# Patient Record
Sex: Female | Born: 1985 | Race: Black or African American | Hispanic: No | Marital: Single | Smoking: Former smoker
Health system: Southern US, Community
[De-identification: ages and names within clinical notes are randomized; demographics above are authoritative.]

## PROBLEM LIST (undated history)

## (undated) DIAGNOSIS — E669 Obesity, unspecified: Secondary | ICD-10-CM

## (undated) HISTORY — PX: TONSILLECTOMY: SUR1361

---
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
## 2006-04-25 ENCOUNTER — Emergency Department (HOSPITAL_COMMUNITY): Admission: EM | Admit: 2006-04-25 | Discharge: 2006-04-26 | Payer: Self-pay | Admitting: Emergency Medicine

## 2006-04-29 ENCOUNTER — Emergency Department (HOSPITAL_COMMUNITY): Admission: EM | Admit: 2006-04-29 | Discharge: 2006-04-29 | Payer: Self-pay | Admitting: Emergency Medicine

## 2006-12-07 ENCOUNTER — Emergency Department (HOSPITAL_COMMUNITY): Admission: EM | Admit: 2006-12-07 | Discharge: 2006-12-08 | Payer: Self-pay | Admitting: Emergency Medicine

## 2007-11-07 ENCOUNTER — Emergency Department (HOSPITAL_COMMUNITY): Admission: EM | Admit: 2007-11-07 | Discharge: 2007-11-07 | Payer: Self-pay | Admitting: Emergency Medicine

## 2015-07-10 ENCOUNTER — Emergency Department (HOSPITAL_COMMUNITY)
Admission: EM | Admit: 2015-07-10 | Discharge: 2015-07-10 | Disposition: A | Discharge status: Home / Self Care | Payer: BLUE CROSS/BLUE SHIELD | Attending: Emergency Medicine | Admitting: Emergency Medicine

## 2015-07-10 ENCOUNTER — Emergency Department (HOSPITAL_COMMUNITY): Payer: BLUE CROSS/BLUE SHIELD

## 2015-07-10 ENCOUNTER — Encounter (HOSPITAL_COMMUNITY): Payer: Self-pay | Admitting: Emergency Medicine

## 2015-07-10 DIAGNOSIS — J069 Acute upper respiratory infection, unspecified: Secondary | ICD-10-CM | POA: Insufficient documentation

## 2015-07-10 DIAGNOSIS — E669 Obesity, unspecified: Secondary | ICD-10-CM | POA: Insufficient documentation

## 2015-07-10 DIAGNOSIS — H9202 Otalgia, left ear: Secondary | ICD-10-CM | POA: Diagnosis not present

## 2015-07-10 DIAGNOSIS — R05 Cough: Secondary | ICD-10-CM | POA: Diagnosis present

## 2015-07-10 HISTORY — DX: Obesity, unspecified: E66.9

## 2015-07-10 MED ORDER — BENZONATATE 100 MG PO CAPS: 100.0000 mg | ORAL_CAPSULE | Freq: Three times a day (TID) | ORAL | Status: AC

## 2015-07-10 MED ORDER — HYDROCOD POLST-CPM POLST ER 10-8 MG/5ML PO SUER: 5.0000 mL | Freq: Two times a day (BID) | ORAL | Status: DC

## 2015-07-10 NOTE — ED Notes (Signed)
Pt able to ambulate independently 

## 2015-07-10 NOTE — ED Notes (Signed)
Pt. reports persistent dry cough for 1 1/2 weeks currently taking oral antibiotic ( Amoxicillin ) with no improvement , pt. added left ear discomfort , denies fever or chills / respirations unlabored .

## 2015-07-10 NOTE — ED Notes (Signed)
Spoke to radiology.  Will come to get patient

## 2015-07-10 NOTE — Discharge Instructions (Signed)
You were evaluated in the ED today for your cough. There does not appear to be an emergent cause for your symptoms at this time. Your exam was reassuring. Your chest x-ray showed no evidence of pneumonia. Your symptoms are likely viral in nature. Please take your medications as prescribed. Follow-up with your doctor as needed. Return to ED for new or worsening symptoms.  Upper Respiratory Infection, Adult Most upper respiratory infections (URIs) are a viral infection of the air passages leading to the lungs. A URI affects the nose, throat, and upper air passages. The most common type of URI is nasopharyngitis and is typically referred to as "the common cold." URIs run their course and usually go away on their own. Most of the time, a URI does not require medical attention, but sometimes a bacterial infection in the upper airways can follow a viral infection. This is called a secondary infection. Sinus and middle ear infections are common types of secondary upper respiratory infections. Bacterial pneumonia can also complicate a URI. A URI can worsen asthma and chronic obstructive pulmonary disease (COPD). Sometimes, these complications can require emergency medical care and may be life threatening.  CAUSES Almost all URIs are caused by viruses. A virus is a type of germ and can spread from one person to another.  RISKS FACTORS You may be at risk for a URI if:   You smoke.   You have chronic heart or lung disease.  You have a weakened defense (immune) system.   You are very young or very old.   You have nasal allergies or asthma.  You work in crowded or poorly ventilated areas.  You work in health care facilities or schools. SIGNS AND SYMPTOMS  Symptoms typically develop 2-3 days after you come in contact with a cold virus. Most viral URIs last 7-10 days. However, viral URIs from the influenza virus (flu virus) can last 14-18 days and are typically more severe. Symptoms may include:    Runny or stuffy (congested) nose.   Sneezing.   Cough.   Sore throat.   Headache.   Fatigue.   Fever.   Loss of appetite.   Pain in your forehead, behind your eyes, and over your cheekbones (sinus pain).  Muscle aches.  DIAGNOSIS  Your health care provider may diagnose a URI by:  Physical exam.  Tests to check that your symptoms are not due to another condition such as:  Strep throat.  Sinusitis.  Pneumonia.  Asthma. TREATMENT  A URI goes away on its own with time. It cannot be cured with medicines, but medicines may be prescribed or recommended to relieve symptoms. Medicines may help:  Reduce your fever.  Reduce your cough.  Relieve nasal congestion. HOME CARE INSTRUCTIONS   Take medicines only as directed by your health care provider.   Gargle warm saltwater or take cough drops to comfort your throat as directed by your health care provider.  Use a warm mist humidifier or inhale steam from a shower to increase air moisture. This may make it easier to breathe.  Drink enough fluid to keep your urine clear or pale yellow.   Eat soups and other clear broths and maintain good nutrition.   Rest as needed.   Return to work when your temperature has returned to normal or as your health care provider advises. You may need to stay home longer to avoid infecting others. You can also use a face mask and careful hand washing to prevent spread of the  virus.  Increase the usage of your inhaler if you have asthma.   Do not use any tobacco products, including cigarettes, chewing tobacco, or electronic cigarettes. If you need help quitting, ask your health care provider. PREVENTION  The best way to protect yourself from getting a cold is to practice good hygiene.   Avoid oral or hand contact with people with cold symptoms.   Wash your hands often if contact occurs.  There is no clear evidence that vitamin C, vitamin E, echinacea, or exercise  reduces the chance of developing a cold. However, it is always recommended to get plenty of rest, exercise, and practice good nutrition.  SEEK MEDICAL CARE IF:   You are getting worse rather than better.   Your symptoms are not controlled by medicine.   You have chills.  You have worsening shortness of breath.  You have brown or red mucus.  You have yellow or brown nasal discharge.  You have pain in your face, especially when you bend forward.  You have a fever.  You have swollen neck glands.  You have pain while swallowing.  You have white areas in the back of your throat. SEEK IMMEDIATE MEDICAL CARE IF:   You have severe or persistent:  Headache.  Ear pain.  Sinus pain.  Chest pain.  You have chronic lung disease and any of the following:  Wheezing.  Prolonged cough.  Coughing up blood.  A change in your usual mucus.  You have a stiff neck.  You have changes in your:  Vision.  Hearing.  Thinking.  Mood. MAKE SURE YOU:   Understand these instructions.  Will watch your condition.  Will get help right away if you are not doing well or get worse.   This information is not intended to replace advice given to you by your health care provider. Make sure you discuss any questions you have with your health care provider.   Document Released: 01/20/2001 Document Revised: 12/11/2014 Document Reviewed: 11/01/2013 Elsevier Interactive Patient Education [employer]2016 Elsevier Inc.

## 2015-07-10 NOTE — ED Provider Notes (Signed)
CSN: [id number]     Arrival date & time 07/10/15  2034 History  By signing my name below, I, Tanda RockersMargaux Venter, attest that this documentation has been prepared under the direction and in the presence of General MillsBenjamin Jermari Tamargo, PA-C. Electronically Signed: Tanda RockersMargaux Venter, ED Scribe. 07/10/2015. 9:00 PM.  Chief Complaint  Patient presents with  . Cough  . Otalgia   The history is provided by the patient. No language interpreter was used.     HPI Comments: Brittany Mcmillan is a 29 y.o. female who presents to the Emergency Department complaining of gradual onset, constant,  productive cough with yellow phlegm and myalgias x 1.5 weeks. Pt also complains of left ear pain with muffled hearing, sore throat from coughing, and increased dyspnea on exertion. She was seen at [facility] and was given prescription for Amoxicillin without relief. Denies fever, chills, hemoptysis, leg swelling, or any other associated symptoms. No recent travel or surgeries. No hx DVT/PE. No hx cancer.   Past Medical History  Diagnosis Date  . Obesity    Past Surgical History  Procedure Laterality Date  . Tonsillectomy     No family history on file. Social History  Substance Use Topics  . Smoking status: Never Smoker   . Smokeless tobacco: None  . Alcohol Use: Yes   OB History    No data available     Review of Systems  Constitutional: Negative for fever and chills.  HENT: Positive for ear pain.   Respiratory: Positive for cough and shortness of breath.   Cardiovascular: Negative for leg swelling.  Gastrointestinal: Negative for nausea and vomiting.  Musculoskeletal: Positive for myalgias.  All other systems reviewed and are negative.  Allergies  Review of patient's allergies indicates no known allergies.  Home Medications   Prior to Admission medications   Medication Sig Start Date End Date Taking? Authorizing Provider  amoxicillin-clavulanate (AUGMENTIN) 875-125 MG tablet Take 1 tablet by mouth 2 (two)  times daily.   Yes Historical Provider, MD  benzonatate (TESSALON) 100 MG capsule Take 1 capsule (100 mg total) by mouth every 8 (eight) hours. 07/10/15   Joycie PeekBenjamin Aastha Dayley, PA-C  chlorpheniramine-HYDROcodone (TUSSIONEX PENNKINETIC ER) 10-8 MG/5ML SUER Take 5 mLs by mouth 2 (two) times daily. 07/10/15   Joycie PeekBenjamin Prather Failla, PA-C   Triage Vitals: BP 143/89 mmHg  Pulse 108  Temp(Src) 98.2 F (36.8 C) (Oral)  Resp 16  Ht 5' 3.5" (1.613 m)  Wt 461 lb (209.108 kg)  BMI 80.37 kg/m2  SpO2 98%  LMP 06/19/2015 (Approximate)   Physical Exam  Constitutional: She is oriented to person, place, and time. She appears well-developed. No distress.  Obese  HENT:  Head: Normocephalic and atraumatic.  Right Ear: Tympanic membrane normal.  Left Ear: Tympanic membrane normal.  Mouth/Throat: Oropharynx is clear and moist.  Eyes: Conjunctivae and EOM are normal.  Neck: Neck supple. No tracheal deviation present.  Cardiovascular: Normal rate, regular rhythm and normal heart sounds.   Pulmonary/Chest: Effort normal.  Could not appreciate lung sounds due to body habitus   Abdominal: Soft. There is no tenderness.  Musculoskeletal: Normal range of motion. She exhibits no tenderness.  Neurological: She is alert and oriented to person, place, and time.  Gait is baseline. Moves all extremities without ataxia  Skin: Skin is warm and dry.  Psychiatric: She has a normal mood and affect. Her behavior is normal.  Nursing note and vitals reviewed.   ED Course  Procedures (including critical care time)  DIAGNOSTIC STUDIES: Oxygen Saturation  is 98% on RA, normal by my interpretation.    COORDINATION OF CARE: 8:59 PM-Discussed treatment plan which includes CXR with pt at bedside and pt agreed to plan.   Imaging Review Dg Chest 2 View  07/10/2015  CLINICAL DATA:  Chest pain with cough. EXAM: CHEST  2 VIEW COMPARISON:  04/26/2006 FINDINGS: The heart size and mediastinal contours are within normal limits. Both  lungs are clear. The visualized skeletal structures are unremarkable. IMPRESSION: No active cardiopulmonary disease. Electronically Signed   By: Elige Ko   On: 07/10/2015 22:23   I have personally reviewed and evaluated these images as part of my medical decision-making.  Meds given in ED:  Medications - No data to display  New Prescriptions   BENZONATATE (TESSALON) 100 MG CAPSULE    Take 1 capsule (100 mg total) by mouth every 8 (eight) hours.   CHLORPHENIRAMINE-HYDROCODONE (TUSSIONEX PENNKINETIC ER) 10-8 MG/5ML SUER    Take 5 mLs by mouth 2 (two) times daily.   Filed Vitals:   07/10/15 2042 07/10/15 2237  BP: 143/89 124/63  Pulse: 108 86  Temp: 98.2 F (36.8 C) 98.3 F (36.8 C)  TempSrc: Oral Oral  Resp: 16 18  Height: 5' 3.5" (1.613 m)   Weight: 209.108 kg   SpO2: 98% 100%    MDM  Pt symptoms consistent with URI. CXR negative for acute infiltrate. Likely viral in nature. Pt will be discharged with symptomatic treatment.  Discussed return precautions.  Pt is hemodynamically stable, afebrile & in NAD prior to discharge. Suspect original tachycardia was after patient was walking back to exam room. No tachycardia on my exam or upon discharge. Low suspicion for PE or other acute or emergent cardiopulmonary pathology. Patient overall appears well, nontoxic and is appropriate for outpatient follow-up.  Final diagnoses:  URI (upper respiratory infection)    I personally performed the services described in this documentation, which was scribed in my presence. The recorded information has been reviewed and is accurate.      Joycie Peek, PA-C 07/10/15 2243  Rolland Porter, MD 07/24/15 [id number]

## 2015-08-15 ENCOUNTER — Encounter (HOSPITAL_COMMUNITY): Payer: Self-pay | Admitting: Neurology

## 2015-08-15 ENCOUNTER — Emergency Department (HOSPITAL_COMMUNITY)
Admission: EM | Admit: 2015-08-15 | Discharge: 2015-08-15 | Disposition: A | Discharge status: Home / Self Care | Payer: BLUE CROSS/BLUE SHIELD | Attending: Emergency Medicine | Admitting: Emergency Medicine

## 2015-08-15 DIAGNOSIS — Z792 Long term (current) use of antibiotics: Secondary | ICD-10-CM | POA: Insufficient documentation

## 2015-08-15 DIAGNOSIS — R05 Cough: Secondary | ICD-10-CM | POA: Diagnosis present

## 2015-08-15 DIAGNOSIS — E669 Obesity, unspecified: Secondary | ICD-10-CM | POA: Diagnosis not present

## 2015-08-15 DIAGNOSIS — Z79899 Other long term (current) drug therapy: Secondary | ICD-10-CM | POA: Diagnosis not present

## 2015-08-15 DIAGNOSIS — J069 Acute upper respiratory infection, unspecified: Secondary | ICD-10-CM | POA: Diagnosis not present

## 2015-08-15 MED ORDER — HYDROCOD POLST-CPM POLST ER 10-8 MG/5ML PO SUER: 5.0000 mL | Freq: Two times a day (BID) | ORAL | Status: AC

## 2015-08-15 MED ORDER — SALINE SPRAY 0.65 % NA SOLN: 1.0000 | NASAL | Status: AC | PRN

## 2015-08-15 NOTE — Discharge Instructions (Signed)
Upper Respiratory Infection, Adult Most upper respiratory infections (URIs) are a viral infection of the air passages leading to the lungs. A URI affects the nose, throat, and upper air passages. The most common type of URI is nasopharyngitis and is typically referred to as "the common cold." URIs run their course and usually go away on their own. Most of the time, a URI does not require medical attention, but sometimes a bacterial infection in the upper airways can follow a viral infection. This is called a secondary infection. Sinus and middle ear infections are common types of secondary upper respiratory infections. Bacterial pneumonia can also complicate a URI. A URI can worsen asthma and chronic obstructive pulmonary disease (COPD). Sometimes, these complications can require emergency medical care and may be life threatening.  CAUSES Almost all URIs are caused by viruses. A virus is a type of germ and can spread from one person to another.  RISKS FACTORS You may be at risk for a URI if:   You smoke.   You have chronic heart or lung disease.  You have a weakened defense (immune) system.   You are very young or very old.   You have nasal allergies or asthma.  You work in crowded or poorly ventilated areas.  You work in health care facilities or schools. SIGNS AND SYMPTOMS  Symptoms typically develop 2-3 days after you come in contact with a cold virus. Most viral URIs last 7-10 days. However, viral URIs from the influenza virus (flu virus) can last 14-18 days and are typically more severe. Symptoms may include:   Runny or stuffy (congested) nose.   Sneezing.   Cough.   Sore throat.   Headache.   Fatigue.   Fever.   Loss of appetite.   Pain in your forehead, behind your eyes, and over your cheekbones (sinus pain).  Muscle aches.  DIAGNOSIS  Your health care provider may diagnose a URI by:  Physical exam.  Tests to check that your symptoms are not due to  another condition such as:  Strep throat.  Sinusitis.  Pneumonia.  Asthma. TREATMENT  A URI goes away on its own with time. It cannot be cured with medicines, but medicines may be prescribed or recommended to relieve symptoms. Medicines may help:  Reduce your fever.  Reduce your cough.  Relieve nasal congestion. HOME CARE INSTRUCTIONS   Take medicines only as directed by your health care provider.   Gargle warm saltwater or take cough drops to comfort your throat as directed by your health care provider.  Use a warm mist humidifier or inhale steam from a shower to increase air moisture. This may make it easier to breathe.  Drink enough fluid to keep your urine clear or pale yellow.   Eat soups and other clear broths and maintain good nutrition.   Rest as needed.   Return to work when your temperature has returned to normal or as your health care provider advises. You may need to stay home longer to avoid infecting others. You can also use a face mask and careful hand washing to prevent spread of the virus.  Increase the usage of your inhaler if you have asthma.   Do not use any tobacco products, including cigarettes, chewing tobacco, or electronic cigarettes. If you need help quitting, ask your health care provider. PREVENTION  The best way to protect yourself from getting a cold is to practice good hygiene.   Avoid oral or hand contact with people with cold   symptoms.   Wash your hands often if contact occurs.  There is no clear evidence that vitamin C, vitamin E, echinacea, or exercise reduces the chance of developing a cold. However, it is always recommended to get plenty of rest, exercise, and practice good nutrition.  SEEK MEDICAL CARE IF:   You are getting worse rather than better.   Your symptoms are not controlled by medicine.   You have chills.  You have worsening shortness of breath.  You have brown or red mucus.  You have yellow or brown nasal  discharge.  You have pain in your face, especially when you bend forward.  You have a fever.  You have swollen neck glands.  You have pain while swallowing.  You have white areas in the back of your throat. SEEK IMMEDIATE MEDICAL CARE IF:   You have severe or persistent:  Headache.  Ear pain.  Sinus pain.  Chest pain.  You have chronic lung disease and any of the following:  Wheezing.  Prolonged cough.  Coughing up blood.  A change in your usual mucus.  You have a stiff neck.  You have changes in your:  Vision.  Hearing.  Thinking.  Mood. MAKE SURE YOU:   Understand these instructions.  Will watch your condition.  Will get help right away if you are not doing well or get worse.   This information is not intended to replace advice given to you by your health care provider. Make sure you discuss any questions you have with your health care provider.   Document Released: 01/20/2001 Document Revised: 12/11/2014 Document Reviewed: 11/01/2013 Elsevier Interactive Patient Education 2016 Elsevier Inc.  

## 2015-08-15 NOTE — ED Notes (Addendum)
Pt here with cough with yellow mucus for several days. Runny nose, and nasal congestion.

## 2015-08-15 NOTE — ED Provider Notes (Signed)
CSN: [id number]     Arrival date & time 08/15/15  1431 History  By signing my name below, I, Essence Howell, attest that this documentation has been prepared under the direction and in the presence of Roxy Horsemanobert Kateryn Marasigan, PA-C Electronically Signed: Charline BillsEssence Howell, ED Scribe 08/15/2015 at 3:56 PM.   Chief Complaint  Patient presents with  . Cough   The history is provided by the patient. No language interpreter was used.   HPI Comments: Brittany Mcmillan is a 30 y.o. female who presents to the Emergency Department with a chief complaint of peristent productive cough with yellow mucus for the past few days. Pt reports associated rhinorrhea, nasal congestion and sneezing. She has tried several OTC medications and consuming plenty fluids without significant relief. Denies fever. No other associated symptoms.Pt is a nonsmoker.   Past Medical History  Diagnosis Date  . Obesity    Past Surgical History  Procedure Laterality Date  . Tonsillectomy     No family history on file. Social History  Substance Use Topics  . Smoking status: Never Smoker   . Smokeless tobacco: None  . Alcohol Use: Yes   OB History    No data available     Review of Systems  Constitutional: Positive for chills. Negative for fever.  HENT: Positive for congestion, postnasal drip, rhinorrhea, sinus pressure, sneezing and sore throat.   Respiratory: Positive for cough. Negative for shortness of breath.   Cardiovascular: Negative for chest pain.  Gastrointestinal: Negative for nausea, vomiting, abdominal pain, diarrhea and constipation.  Genitourinary: Negative for dysuria.   Allergies  Review of patient's allergies indicates no known allergies.  Home Medications   Prior to Admission medications   Medication Sig Start Date End Date Taking? Authorizing Provider  amoxicillin-clavulanate (AUGMENTIN) 875-125 MG tablet Take 1 tablet by mouth 2 (two) times daily.    Historical Provider, MD  benzonatate (TESSALON) 100 MG  capsule Take 1 capsule (100 mg total) by mouth every 8 (eight) hours. 07/10/15   Joycie PeekBenjamin Cartner, PA-C  chlorpheniramine-HYDROcodone (TUSSIONEX PENNKINETIC ER) 10-8 MG/5ML SUER Take 5 mLs by mouth 2 (two) times daily. 07/10/15   Joycie PeekBenjamin Cartner, PA-C   BP 127/79 mmHg  Pulse 93  Temp(Src) 98.1 F (36.7 C) (Oral)  Resp 20  SpO2 99%  LMP 07/11/2015 Physical Exam Physical Exam  Constitutional: Pt  is oriented to person, place, and time. Appears well-developed and well-nourished. No distress.  HENT:  Head: Normocephalic and atraumatic.  Right Ear: Tympanic membrane, external ear and ear canal normal.  Left Ear: Tympanic membrane, external ear and ear canal normal.  Nose: Mucosal edema and mild rhinorrhea present. No epistaxis. Right sinus exhibits no maxillary sinus tenderness and no frontal sinus tenderness. Left sinus exhibits no maxillary sinus tenderness and no frontal sinus tenderness.  Mouth/Throat: Uvula is midline and mucous membranes are normal. Mucous membranes are not pale and not cyanotic. No oropharyngeal exudate, posterior oropharyngeal edema, posterior oropharyngeal erythema or tonsillar abscesses.  Eyes: Conjunctivae are normal. Pupils are equal, round, and reactive to light.  Neck: Normal range of motion and full passive range of motion without pain.  Cardiovascular: Normal rate and intact distal pulses.   Pulmonary/Chest: Effort normal and breath sounds normal. No stridor.  Clear and equal breath sounds without focal wheezes, rhonchi, rales  Abdominal: Soft. Bowel sounds are normal. There is no tenderness.  Musculoskeletal: Normal range of motion.  Lymphadenopathy:    Pthas no cervical adenopathy.  Neurological: Pt is alert and oriented to person, place,  and time.  Skin: Skin is warm and dry. No rash noted. Pt is not diaphoretic.  Psychiatric: Normal mood and affect.  Nursing note and vitals reviewed.  ED Course  Procedures (including critical care time) DIAGNOSTIC  STUDIES: Oxygen Saturation is 99% on RA, normal by my interpretation.    COORDINATION OF CARE: 3:50 PM-Discussed treatment plan which includes Tussionex and nasal spray with pt at bedside and pt agreed to plan.     MDM   Final diagnoses:  URI (upper respiratory infection)     Patients symptoms are consistent with URI, likely viral etiology. Discussed that antibiotics are not indicated for viral infections. Pt will be discharged with symptomatic treatment.  Verbalizes understanding and is agreeable with plan. Pt is hemodynamically stable & in NAD prior to dc.   I personally performed the services described in this documentation, which was scribed in my presence. The recorded information has been reviewed and is accurate.      Roxy Horseman, PA-C 08/15/15 1558  Lavera Guise, MD 08/16/15 0630

## 2015-10-19 ENCOUNTER — Emergency Department (HOSPITAL_COMMUNITY): Payer: Self-pay

## 2015-10-19 ENCOUNTER — Encounter (HOSPITAL_COMMUNITY): Payer: Self-pay

## 2015-10-19 ENCOUNTER — Emergency Department (HOSPITAL_COMMUNITY)
Admission: EM | Admit: 2015-10-19 | Discharge: 2015-10-19 | Disposition: A | Discharge status: Home / Self Care | Payer: Self-pay | Attending: Emergency Medicine | Admitting: Emergency Medicine

## 2015-10-19 DIAGNOSIS — E669 Obesity, unspecified: Secondary | ICD-10-CM | POA: Insufficient documentation

## 2015-10-19 DIAGNOSIS — J069 Acute upper respiratory infection, unspecified: Secondary | ICD-10-CM

## 2015-10-19 DIAGNOSIS — Z79899 Other long term (current) drug therapy: Secondary | ICD-10-CM | POA: Insufficient documentation

## 2015-10-19 DIAGNOSIS — F17210 Nicotine dependence, cigarettes, uncomplicated: Secondary | ICD-10-CM | POA: Insufficient documentation

## 2015-10-19 DIAGNOSIS — Z792 Long term (current) use of antibiotics: Secondary | ICD-10-CM | POA: Insufficient documentation

## 2015-10-19 NOTE — Discharge Instructions (Signed)
Upper Respiratory Infection, Adult Most upper respiratory infections (URIs) are a viral infection of the air passages leading to the lungs. A URI affects the nose, throat, and upper air passages. The most common type of URI is nasopharyngitis and is typically referred to as "the common cold." URIs run their course and usually go away on their own. Most of the time, a URI does not require medical attention, but sometimes a bacterial infection in the upper airways can follow a viral infection. This is called a secondary infection. Sinus and middle ear infections are common types of secondary upper respiratory infections. Bacterial pneumonia can also complicate a URI. A URI can worsen asthma and chronic obstructive pulmonary disease (COPD). Sometimes, these complications can require emergency medical care and may be life threatening.  CAUSES Almost all URIs are caused by viruses. A virus is a type of germ and can spread from one person to another.  RISKS FACTORS You may be at risk for a URI if:   You smoke.   You have chronic heart or lung disease.  You have a weakened defense (immune) system.   You are very young or very old.   You have nasal allergies or asthma.  You work in crowded or poorly ventilated areas.  You work in health care facilities or schools. SIGNS AND SYMPTOMS  Symptoms typically develop 2-3 days after you come in contact with a cold virus. Most viral URIs last 7-10 days. However, viral URIs from the influenza virus (flu virus) can last 14-18 days and are typically more severe. Symptoms may include:   Runny or stuffy (congested) nose.   Sneezing.   Cough.   Sore throat.   Headache.   Fatigue.   Fever.   Loss of appetite.   Pain in your forehead, behind your eyes, and over your cheekbones (sinus pain).  Muscle aches.  DIAGNOSIS  Your health care provider may diagnose a URI by:  Physical exam.  Tests to check that your symptoms are not due to  another condition such as:  Strep throat.  Sinusitis.  Pneumonia.  Asthma. TREATMENT  A URI goes away on its own with time. It cannot be cured with medicines, but medicines may be prescribed or recommended to relieve symptoms. Medicines may help:  Reduce your fever.  Reduce your cough.  Relieve nasal congestion. HOME CARE INSTRUCTIONS   Take medicines only as directed by your health care provider.   Gargle warm saltwater or take cough drops to comfort your throat as directed by your health care provider.  Use a warm mist humidifier or inhale steam from a shower to increase air moisture. This may make it easier to breathe.  Drink enough fluid to keep your urine clear or pale yellow.   Eat soups and other clear broths and maintain good nutrition.   Rest as needed.   Return to work when your temperature has returned to normal or as your health care provider advises. You may need to stay home longer to avoid infecting others. You can also use a face mask and careful hand washing to prevent spread of the virus.  Increase the usage of your inhaler if you have asthma.   Do not use any tobacco products, including cigarettes, chewing tobacco, or electronic cigarettes. If you need help quitting, ask your health care provider. PREVENTION  The best way to protect yourself from getting a cold is to practice good hygiene.   Avoid oral or hand contact with people with cold  symptoms.   Wash your hands often if contact occurs.  There is no clear evidence that vitamin C, vitamin E, echinacea, or exercise reduces the chance of developing a cold. However, it is always recommended to get plenty of rest, exercise, and practice good nutrition.  SEEK MEDICAL CARE IF:   You are getting worse rather than better.   Your symptoms are not controlled by medicine.   You have chills.  You have worsening shortness of breath.  You have brown or red mucus.  You have yellow or brown nasal  discharge.  You have pain in your face, especially when you bend forward.  You have a fever.  You have swollen neck glands.  You have pain while swallowing.  You have white areas in the back of your throat. SEEK IMMEDIATE MEDICAL CARE IF:   You have severe or persistent:  Headache.  Ear pain.  Sinus pain.  Chest pain.  You have chronic lung disease and any of the following:  Wheezing.  Prolonged cough.  Coughing up blood.  A change in your usual mucus.  You have a stiff neck.  You have changes in your:  Vision.  Hearing.  Thinking.  Mood. MAKE SURE YOU:   Understand these instructions.  Will watch your condition.  Will get help right away if you are not doing well or get worse.   This information is not intended to replace advice given to you by your health care provider. Make sure you discuss any questions you have with your health care provider.  Alternate taking ibuprofen and Tylenol every 4 hours for body aches and fever. Encourage adequate hydration, drink plenty of fluids. May take over-the-counter Mucinex for congestion relief. Return to the emergency department if you experience severe worsening of her symptoms, fever, difficulty breathing, vomiting.

## 2015-10-19 NOTE — ED Provider Notes (Signed)
CSN: [id number]     Arrival date & time 10/19/15  1528 History   First MD Initiated Contact with Patient 10/19/15 1719     Chief Complaint  Patient presents with  . Cough     (Consider location/radiation/quality/duration/timing/severity/associated sxs/prior Treatment) HPI  Brittany Mcmillan is a 30 year old female who presents to the emergency room today complaining of cough. Patient states that 4 days ago she began having fever, myalgias, productive cough with yellow sputum. Patient took ibuProfen and Robitussin with minimal relief. Her fevers have since resolved but the myalgias and the productive cough remain. Patient states the cough is keeping her up at night. Patient states that he will issues that have been diagnosed with the flu and bronchitis and she is concerned that she may have contracted this as well. Denies chest pain, shortness of breath, otalgia, rhinorrhea, dizziness, vomiting or diarrhea. Past Medical History  Diagnosis Date  . Obesity    Past Surgical History  Procedure Laterality Date  . Tonsillectomy     History reviewed. No pertinent family history. Social History  Substance Use Topics  . Smoking status: Current Some Day Smoker    Types: Cigarettes  . Smokeless tobacco: None  . Alcohol Use: Yes     Comment: occ    OB History    No data available     Review of Systems  All other systems reviewed and are negative.     Allergies  Review of patient's allergies indicates no known allergies.  Home Medications   Prior to Admission medications   Medication Sig Start Date End Date Taking? Authorizing Provider  amoxicillin-clavulanate (AUGMENTIN) 875-125 MG tablet Take 1 tablet by mouth 2 (two) times daily.    Historical Provider, MD  benzonatate (TESSALON) 100 MG capsule Take 1 capsule (100 mg total) by mouth every 8 (eight) hours. 07/10/15   Joycie Peek, PA-C  chlorpheniramine-HYDROcodone (TUSSIONEX PENNKINETIC ER) 10-8 MG/5ML SUER Take 5 mLs by mouth 2  (two) times daily. 08/15/15   Roxy Horseman, PA-C  sodium chloride (OCEAN) 0.65 % SOLN nasal spray Place 1 spray into both nostrils as needed for congestion. 08/15/15   Roxy Horseman, PA-C   BP 136/80 mmHg  Pulse 101  Temp(Src) 98.3 F (36.8 C) (Oral)  Resp 20  Ht  (1.6 m)  Wt 206.069 kg  BMI 80.50 kg/m2  SpO2 100%  LMP 09/11/2015 Physical Exam  Constitutional: She is oriented to person, place, and time. She appears well-developed and well-nourished. No distress.  HENT:  Head: Normocephalic and atraumatic.  Mouth/Throat: Oropharynx is clear and moist. No oropharyngeal exudate.  Clear nasal discharge   Eyes: Conjunctivae and EOM are normal. Pupils are equal, round, and reactive to light. Right eye exhibits no discharge. Left eye exhibits no discharge. No scleral icterus.  Cardiovascular: Normal rate, regular rhythm, normal heart sounds and intact distal pulses.  Exam reveals no gallop and no friction rub.   No murmur heard. Pulmonary/Chest: Effort normal and breath sounds normal. No respiratory distress. She has no wheezes. She has no rales. She exhibits no tenderness.  Abdominal: Soft. She exhibits no distension. There is no tenderness. There is no guarding.  Musculoskeletal: Normal range of motion. She exhibits no edema.  Neurological: She is alert and oriented to person, place, and time.  Skin: Skin is warm and dry. No rash noted. She is not diaphoretic. No erythema. No pallor.  Psychiatric: She has a normal mood and affect. Her behavior is normal.  Nursing note and vitals reviewed.  ED Course  Procedures (including critical care time) Labs Review Labs Reviewed - No data to display  Imaging Review No results found. I have personally reviewed and evaluated these images and lab results as part of my medical decision-making.   EKG Interpretation None      MDM   Final diagnoses:  URI (upper respiratory infection)   Pt CXR negative for acute infiltrate. Patients  symptoms are consistent with URI, likely viral etiology. Pt is afebrile, appears well in ED. Discussed that antibiotics are not indicated for viral infections. Pt will be discharged with symptomatic treatment.  Verbalizes understanding and is agreeable with plan. Pt is hemodynamically stable & in NAD prior to dc. Follow up with PCP.     Lester KinsmanSamantha Tripp GranvilleDowless, PA-C 10/20/15 1445  Courteney Randall AnLyn Mackuen, MD 10/21/15 [phone]

## 2015-10-19 NOTE — ED Notes (Signed)
Onset 4 days productive cough-yellow.green phlegm, body, aches, chills.  Pt has tried OTC meds with no relief.

## 2015-10-19 NOTE — ED Notes (Signed)
Pt stable, ambulatory, states understanding of discharge instructions 

## 2016-01-21 ENCOUNTER — Emergency Department (HOSPITAL_COMMUNITY)
Admission: EM | Admit: 2016-01-21 | Discharge: 2016-01-21 | Disposition: A | Discharge status: Home / Self Care | Payer: No Typology Code available for payment source | Attending: Emergency Medicine | Admitting: Emergency Medicine

## 2016-01-21 ENCOUNTER — Encounter (HOSPITAL_COMMUNITY): Payer: Self-pay | Admitting: Emergency Medicine

## 2016-01-21 DIAGNOSIS — Y939 Activity, unspecified: Secondary | ICD-10-CM | POA: Diagnosis not present

## 2016-01-21 DIAGNOSIS — Y999 Unspecified external cause status: Secondary | ICD-10-CM | POA: Insufficient documentation

## 2016-01-21 DIAGNOSIS — Y92410 Unspecified street and highway as the place of occurrence of the external cause: Secondary | ICD-10-CM | POA: Insufficient documentation

## 2016-01-21 DIAGNOSIS — F17210 Nicotine dependence, cigarettes, uncomplicated: Secondary | ICD-10-CM | POA: Diagnosis not present

## 2016-01-21 DIAGNOSIS — M545 Low back pain, unspecified: Secondary | ICD-10-CM

## 2016-01-21 DIAGNOSIS — M542 Cervicalgia: Secondary | ICD-10-CM | POA: Diagnosis present

## 2016-01-21 MED ORDER — IBUPROFEN 800 MG PO TABS: 800.0000 mg | ORAL_TABLET | Freq: Three times a day (TID) | ORAL | Status: AC

## 2016-01-21 MED ORDER — IBUPROFEN 400 MG PO TABS
800.0000 mg | ORAL_TABLET | Freq: Once | ORAL | Status: AC
  Administered 2016-01-21: 800 mg via ORAL
  Filled 2016-01-21: qty 2

## 2016-01-21 MED ORDER — CYCLOBENZAPRINE HCL 10 MG PO TABS
10.0000 mg | ORAL_TABLET | Freq: Once | ORAL | Status: AC
  Administered 2016-01-21: 10 mg via ORAL
  Filled 2016-01-21: qty 1

## 2016-01-21 MED ORDER — METHOCARBAMOL 500 MG PO TABS: 500.0000 mg | ORAL_TABLET | Freq: Two times a day (BID) | ORAL | Status: AC
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
## 2016-01-21 NOTE — ED Notes (Signed)
Pt verbalized understanding of d/c instructions and has no further questions. Pt stable and NAD.  
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
## 2016-10-07 ENCOUNTER — Emergency Department (HOSPITAL_COMMUNITY)
Admission: EM | Admit: 2016-10-07 | Discharge: 2016-10-07 | Disposition: A | Discharge status: Home / Self Care | Payer: BLUE CROSS/BLUE SHIELD | Attending: Emergency Medicine | Admitting: Emergency Medicine

## 2016-10-07 ENCOUNTER — Encounter (HOSPITAL_COMMUNITY): Payer: Self-pay

## 2016-10-07 DIAGNOSIS — N939 Abnormal uterine and vaginal bleeding, unspecified: Secondary | ICD-10-CM

## 2016-10-07 DIAGNOSIS — N921 Excessive and frequent menstruation with irregular cycle: Secondary | ICD-10-CM | POA: Insufficient documentation

## 2016-10-07 DIAGNOSIS — F17210 Nicotine dependence, cigarettes, uncomplicated: Secondary | ICD-10-CM | POA: Insufficient documentation

## 2016-10-07 DIAGNOSIS — D500 Iron deficiency anemia secondary to blood loss (chronic): Secondary | ICD-10-CM

## 2016-10-07 DIAGNOSIS — Z79899 Other long term (current) drug therapy: Secondary | ICD-10-CM | POA: Insufficient documentation

## 2016-10-07 LAB — URINALYSIS, ROUTINE W REFLEX MICROSCOPIC
BILIRUBIN URINE: NEGATIVE
Bacteria, UA: NONE SEEN
GLUCOSE, UA: NEGATIVE mg/dL
KETONES UR: NEGATIVE mg/dL
LEUKOCYTES UA: NEGATIVE
Nitrite: NEGATIVE
PH: 5 (ref 5.0–8.0)
PROTEIN: 30 mg/dL — AB
Specific Gravity, Urine: 1.02 (ref 1.005–1.030)

## 2016-10-07 LAB — COMPREHENSIVE METABOLIC PANEL
ALK PHOS: 60 U/L (ref 38–126)
ALT: 13 U/L — ABNORMAL LOW (ref 14–54)
ANION GAP: 9 (ref 5–15)
AST: 16 U/L (ref 15–41)
Albumin: 3.6 g/dL (ref 3.5–5.0)
BUN: 8 mg/dL (ref 6–20)
CALCIUM: 8.3 mg/dL — AB (ref 8.9–10.3)
CHLORIDE: 102 mmol/L (ref 101–111)
CO2: 25 mmol/L (ref 22–32)
Creatinine, Ser: 0.64 mg/dL (ref 0.44–1.00)
Glucose, Bld: 92 mg/dL (ref 65–99)
Potassium: 3.8 mmol/L (ref 3.5–5.1)
SODIUM: 136 mmol/L (ref 135–145)
Total Bilirubin: 0.6 mg/dL (ref 0.3–1.2)
Total Protein: 7.4 g/dL (ref 6.5–8.1)

## 2016-10-07 LAB — I-STAT BETA HCG BLOOD, ED (MC, WL, AP ONLY): I-stat hCG, quantitative: <5 m[IU]/mL (ref <5)

## 2016-10-07 LAB — CBC
HCT: 28.7 % — ABNORMAL LOW (ref 36.0–46.0)
HEMOGLOBIN: 8.3 g/dL — AB (ref 12.0–15.0)
MCH: 18.2 pg — AB (ref 26.0–34.0)
MCHC: 28.9 g/dL — ABNORMAL LOW (ref 30.0–36.0)
MCV: 62.9 fL — ABNORMAL LOW (ref 78.0–100.0)
PLATELETS: 353 10*3/uL (ref 150–400)
RBC: 4.56 MIL/uL (ref 3.87–5.11)
RDW: 19 % — ABNORMAL HIGH (ref 11.5–15.5)
WBC: 8 10*3/uL (ref 4.0–10.5)

## 2016-10-07 LAB — WET PREP, GENITAL
Clue Cells Wet Prep HPF POC: NONE SEEN
SPERM: NONE SEEN
Trich, Wet Prep: NONE SEEN
WBC WET PREP: NONE SEEN
YEAST WET PREP: NONE SEEN

## 2016-10-07 LAB — LIPASE, BLOOD: LIPASE: 14 U/L (ref 11–51)

## 2016-10-07 MED ORDER — NORETHINDRONE ACETATE 5 MG PO TABS: 5.0000 mg | ORAL_TABLET | Freq: Every day | ORAL | 0 refills | Status: AC

## 2016-10-07 NOTE — ED Notes (Signed)
Pt ambulatory and independent at discharge.  Verbalized understanding of discharge instructions 

## 2016-10-07 NOTE — ED Triage Notes (Signed)
Patient c/o generalized abdominal pain with loose stools x 2 months and vaginal bleeding x 1 months.

## 2016-10-07 NOTE — ED Provider Notes (Signed)
[facility]-EMERGENCY DEPT Provider Note   CSN: [id number] Arrival date & time: 10/07/16  1600     History   Chief Complaint Chief Complaint  [id number] presents with  . Abdominal Pain  . Vaginal Bleeding    HPI Brittany Mcmillan is a 31 y.o. female.  HPI  Vaginal bleeding for 2 months, passing clots, changing pad every 30 minutes, has been that way for 2 months, have had hx of irregular menses, last one before this was one month.  Has not been on anything for it now.   Stomach pains, lower abdominal pain/pelvic pain, which was present for over 2 months, diarrhea x 4-5 a day for 2 months, stools dark but not black. Nothing makes pain better or worse.  3 weeks ago had cloudy urine, now improved-no urinary symptoms now. No vomiting. Nausea. Lightheaded, low energy Loss of appetite, dizziness, feeling faint No syncope   Past Medical History:  Diagnosis Date  . Obesity     There are no active problems to display for this patient.   Past Surgical History:  Procedure Laterality Date  . TONSILLECTOMY      OB History    No data available       Home Medications    Prior to Admission medications   Medication Sig Start Date End Date Taking? Authorizing Provider  naproxen sodium (ANAPROX) 220 MG tablet Take 220 mg by mouth 2 (two) times daily as needed (pain).   Yes Historical Provider, MD  benzonatate (TESSALON) 100 MG capsule Take 1 capsule (100 mg total) by mouth every 8 (eight) hours. Patient not taking: Reported on 10/07/2016 07/10/15   Joycie PeekBenjamin Cartner, PA-C  chlorpheniramine-HYDROcodone [facility](TUSSIONEX PENNKINETIC ER) 10-8 MG/5ML SUER Take 5 mLs by mouth 2 (two) times daily. Patient not taking: Reported on 10/07/2016 08/15/15   Roxy Horsemanobert Browning, PA-C  ibuprofen (ADVIL,MOTRIN) 800 MG tablet Take 1 tablet (800 mg total) by mouth 3 (three) times daily. Patient not taking: Reported on 10/07/2016 01/21/16   Rolm GalaStevi Barrett, PA-C  methocarbamol (ROBAXIN) 500 MG tablet Take 1 tablet (500 mg total)  by mouth 2 (two) times daily. Patient not taking: Reported on 10/07/2016 01/21/16   Rolm GalaStevi Barrett, PA-C  norethindrone (AYGESTIN) 5 MG tablet Take 1 tablet (5 mg total) by mouth daily. 10/07/16 10/17/16  Alvira MondayErin Khiyan Crace, MD  sodium chloride (OCEAN) 0.65 % SOLN nasal spray Place 1 spray into both nostrils as needed for congestion. Patient not taking: Reported on 10/07/2016 08/15/15   Roxy Horsemanobert Browning, PA-C    Family History Family History  Problem Relation Age of Onset  . Kidney failure Mother   . Diabetes Mother     Social History Social History  Substance Use Topics  . Smoking status: Current Some Day Smoker    Types: Cigarettes  . Smokeless tobacco: Never Used  . Alcohol use Yes     Comment: occ      Allergies   Patient has no known allergies.   Review of Systems Review of Systems  Constitutional: Positive for fatigue.  HENT: Negative for congestion.   Eyes: Negative for visual disturbance.  Respiratory: Negative for cough and shortness of breath.   Cardiovascular: Negative for chest pain.  Gastrointestinal: Positive for abdominal pain, diarrhea and nausea. Negative for constipation and vomiting.  Genitourinary: Positive for vaginal bleeding. Negative for difficulty urinating, dyspareunia, dysuria and vaginal discharge.  Neurological: Positive for light-headedness. Negative for syncope and headaches.     Physical Exam Updated Vital Signs BP 155/86 (BP Location: Left Arm)  Pulse 100   Temp 98.6 F (37 C) (Oral)   Resp 18   Ht 5\' 4"  (1.626 m)   Wt (!) 450 lb (204.1 kg)   LMP 10/07/2016   SpO2 100%   BMI 77.24 kg/m   Physical Exam  Constitutional: She is oriented to person, place, and time. She appears well-developed and well-nourished. No distress.  HENT:  Head: Normocephalic and atraumatic.  Eyes: Conjunctivae and EOM are normal.  Neck: Normal range of motion.  Cardiovascular: Normal rate, regular rhythm, normal heart sounds and intact distal pulses.  Exam  reveals no gallop and no friction rub.   No murmur heard. Pulmonary/Chest: Effort normal and breath sounds normal. No respiratory distress. She has no wheezes. She has no rales.  Abdominal: Soft. She exhibits no distension. There is no tenderness. There is no guarding.  Genitourinary: Uterus is tender (mild). Cervix exhibits no motion tenderness, no discharge and no friability. Right adnexum displays tenderness (mild). Left adnexum displays tenderness (mild). There is bleeding in the vagina. No foreign body in the vagina.  Musculoskeletal: She exhibits no edema or tenderness.  Neurological: She is alert and oriented to person, place, and time.  Skin: Skin is warm and dry. No rash noted. She is not diaphoretic. No erythema.  Nursing note and vitals reviewed.    ED Treatments / Results  Labs (all labs ordered are listed, but only abnormal results are displayed) Labs Reviewed  COMPREHENSIVE METABOLIC PANEL - Abnormal; Notable for the following:       Result Value   Calcium 8.3 (*)    ALT 13 (*)    All other components within normal limits  CBC - Abnormal; Notable for the following:    Hemoglobin 8.3 (*)    HCT 28.7 (*)    MCV 62.9 (*)    MCH 18.2 (*)    MCHC 28.9 (*)    RDW 19.0 (*)    All other components within normal limits  URINALYSIS, ROUTINE W REFLEX MICROSCOPIC - Abnormal; Notable for the following:    APPearance HAZY (*)    Hgb urine dipstick LARGE (*)    Protein, ur 30 (*)    Squamous Epithelial / LPF 0-5 (*)    All other components within normal limits  WET PREP, GENITAL  LIPASE, BLOOD  HIV ANTIBODY (ROUTINE TESTING)  RPR  I-STAT BETA HCG BLOOD, ED ([facility], [facility], [facility] ONLY)  GC/CHLAMYDIA PROBE AMP () NOT AT [facility]    EKG  EKG Interpretation None       Radiology No results found.  Procedures Procedures (including critical care time)  Medications Ordered in ED Medications - No data to display   Initial Impression / Assessment and Plan / ED Course  I  have reviewed the triage vital signs and the nursing notes.  Pertinent labs & imaging results that were available during my care of the patient were reviewed by me and considered in my medical decision making (see chart for details).     31 year old female with a history of obesity presents with concern for 2 months of vaginal bleeding, abdominal pain, and loose stool. Given duration of symptoms, nonfocal exam, no fevers, have low suspicion for appendicitis, cholecystitis, diverticulitis, obstruction.  Pregnancy test negative. Other labs show no sign of pancreatitis or hepatitis.  CBC shows hemoglobin 8.3. Recommend PCP follow up, discussed return precautions.  Given amount of bleeding described and duration of symptoms, will place on 10 days of norethindrone.  Possible anovulatory bleeding or polycystic  ovarian syndrome. Recommend close GYN follow up.    Final Clinical Impressions(s) / ED Diagnoses   Final diagnoses:  Vaginal bleeding  Menometrorrhagia  Iron deficiency anemia due to chronic blood loss    New Prescriptions Discharge Medication List as of 10/07/2016  7:53 PM    START taking these medications   Details  norethindrone (AYGESTIN) 5 MG tablet Take 1 tablet (5 mg total) by mouth daily., Starting Wed 10/07/2016, Until Sat 10/17/2016, Print         Alvira Monday, MD 10/08/16 [phone]

## 2016-10-08 LAB — RPR: RPR Ser Ql: NONREACTIVE

## 2016-10-08 LAB — GC/CHLAMYDIA PROBE AMP (~~LOC~~) NOT AT ARMC
CHLAMYDIA, DNA PROBE: NEGATIVE
NEISSERIA GONORRHEA: NEGATIVE

## 2016-10-08 LAB — HIV ANTIBODY (ROUTINE TESTING W REFLEX): HIV Screen 4th Generation wRfx: NONREACTIVE

## 2017-04-22 ENCOUNTER — Encounter (HOSPITAL_BASED_OUTPATIENT_CLINIC_OR_DEPARTMENT_OTHER): Payer: Self-pay | Admitting: Emergency Medicine

## 2017-04-22 ENCOUNTER — Emergency Department (HOSPITAL_BASED_OUTPATIENT_CLINIC_OR_DEPARTMENT_OTHER)
Admission: EM | Admit: 2017-04-22 | Discharge: 2017-04-22 | Disposition: A | Discharge status: Home / Self Care | Payer: BLUE CROSS/BLUE SHIELD | Attending: Emergency Medicine | Admitting: Emergency Medicine

## 2017-04-22 DIAGNOSIS — M549 Dorsalgia, unspecified: Secondary | ICD-10-CM | POA: Insufficient documentation

## 2017-04-22 DIAGNOSIS — Z5321 Procedure and treatment not carried out due to patient leaving prior to being seen by health care provider: Secondary | ICD-10-CM | POA: Insufficient documentation

## 2017-04-22 DIAGNOSIS — M542 Cervicalgia: Secondary | ICD-10-CM | POA: Insufficient documentation

## 2017-04-22 LAB — PREGNANCY, URINE: PREG TEST UR: NEGATIVE
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
## 2017-04-22 NOTE — ED Notes (Signed)
Called to treatment room with no answer from lobby. Registration states pt left. 

## 2017-04-23 ENCOUNTER — Emergency Department (HOSPITAL_BASED_OUTPATIENT_CLINIC_OR_DEPARTMENT_OTHER)
Admission: EM | Admit: 2017-04-23 | Discharge: 2017-04-23 | Disposition: A | Discharge status: Home / Self Care | Payer: No Typology Code available for payment source | Attending: Emergency Medicine | Admitting: Emergency Medicine

## 2017-04-23 ENCOUNTER — Encounter (HOSPITAL_BASED_OUTPATIENT_CLINIC_OR_DEPARTMENT_OTHER): Payer: Self-pay | Admitting: Emergency Medicine

## 2017-04-23 DIAGNOSIS — S29012A Strain of muscle and tendon of back wall of thorax, initial encounter: Secondary | ICD-10-CM | POA: Insufficient documentation

## 2017-04-23 DIAGNOSIS — Y939 Activity, unspecified: Secondary | ICD-10-CM | POA: Diagnosis not present

## 2017-04-23 DIAGNOSIS — S39012A Strain of muscle, fascia and tendon of lower back, initial encounter: Secondary | ICD-10-CM | POA: Insufficient documentation

## 2017-04-23 DIAGNOSIS — Y929 Unspecified place or not applicable: Secondary | ICD-10-CM | POA: Diagnosis not present

## 2017-04-23 DIAGNOSIS — Z87891 Personal history of nicotine dependence: Secondary | ICD-10-CM | POA: Insufficient documentation

## 2017-04-23 DIAGNOSIS — Y999 Unspecified external cause status: Secondary | ICD-10-CM | POA: Diagnosis not present

## 2017-04-23 DIAGNOSIS — S299XXA Unspecified injury of thorax, initial encounter: Secondary | ICD-10-CM | POA: Diagnosis present

## 2017-04-23 DIAGNOSIS — S29019A Strain of muscle and tendon of unspecified wall of thorax, initial encounter: Secondary | ICD-10-CM

## 2017-04-23 MED ORDER — IBUPROFEN 400 MG PO TABS
600.0000 mg | ORAL_TABLET | Freq: Once | ORAL | Status: AC
  Administered 2017-04-23: 600 mg via ORAL
  Filled 2017-04-23: qty 1

## 2017-04-23 MED ORDER — ACETAMINOPHEN 500 MG PO TABS
1000.0000 mg | ORAL_TABLET | Freq: Once | ORAL | Status: AC
  Administered 2017-04-23: 1000 mg via ORAL
  Filled 2017-04-23: qty 2

## 2017-04-23 MED ORDER — METHOCARBAMOL 500 MG PO TABS: 1000.0000 mg | ORAL_TABLET | Freq: Three times a day (TID) | ORAL | 0 refills | Status: AC | PRN

## 2017-04-23 NOTE — Discharge Instructions (Signed)
It was our pleasure to provide your ER care today - we hope that you feel better.  Take ibuprofen or acetaminophen as need for pain.  You may take robaxin as need for muscle spasm/pain - no driving when taking.  Follow up with primary care doctor 1-2 weeks if symptoms fail to improve/resolve.

## 2017-04-23 NOTE — ED Notes (Signed)
ED Provider at bedside. 
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
## 2017-04-30 ENCOUNTER — Emergency Department (HOSPITAL_COMMUNITY): Payer: Self-pay

## 2017-04-30 ENCOUNTER — Emergency Department (HOSPITAL_COMMUNITY)
Admission: EM | Admit: 2017-04-30 | Discharge: 2017-04-30 | Disposition: A | Discharge status: Home / Self Care | Payer: Self-pay | Attending: Emergency Medicine | Admitting: Emergency Medicine

## 2017-04-30 ENCOUNTER — Encounter (HOSPITAL_COMMUNITY): Payer: Self-pay

## 2017-04-30 DIAGNOSIS — R0789 Other chest pain: Secondary | ICD-10-CM | POA: Insufficient documentation

## 2017-04-30 DIAGNOSIS — M545 Low back pain: Secondary | ICD-10-CM | POA: Insufficient documentation

## 2017-04-30 DIAGNOSIS — G8929 Other chronic pain: Secondary | ICD-10-CM | POA: Insufficient documentation

## 2017-04-30 DIAGNOSIS — Z87891 Personal history of nicotine dependence: Secondary | ICD-10-CM | POA: Insufficient documentation

## 2017-04-30 LAB — BASIC METABOLIC PANEL
ANION GAP: 8 (ref 5–15)
BUN: 7 mg/dL (ref 6–20)
CALCIUM: 9 mg/dL (ref 8.9–10.3)
CO2: 25 mmol/L (ref 22–32)
CREATININE: 0.66 mg/dL (ref 0.44–1.00)
Chloride: 105 mmol/L (ref 101–111)
Glucose, Bld: 102 mg/dL — ABNORMAL HIGH (ref 65–99)
Potassium: 3.8 mmol/L (ref 3.5–5.1)
SODIUM: 138 mmol/L (ref 135–145)

## 2017-04-30 LAB — URINALYSIS, ROUTINE W REFLEX MICROSCOPIC
BILIRUBIN URINE: NEGATIVE
GLUCOSE, UA: NEGATIVE mg/dL
Hgb urine dipstick: NEGATIVE
KETONES UR: 5 mg/dL — AB
LEUKOCYTES UA: NEGATIVE
NITRITE: NEGATIVE
PH: 5 (ref 5.0–8.0)
PROTEIN: NEGATIVE mg/dL
Specific Gravity, Urine: 1.024 (ref 1.005–1.030)

## 2017-04-30 LAB — I-STAT TROPONIN, ED: TROPONIN I, POC: 0 ng/mL (ref 0.00–0.08)

## 2017-04-30 LAB — CBC
HCT: 32.1 % — ABNORMAL LOW (ref 36.0–46.0)
HEMOGLOBIN: 9.5 g/dL — AB (ref 12.0–15.0)
MCH: 19.1 pg — AB (ref 26.0–34.0)
MCHC: 29.6 g/dL — AB (ref 30.0–36.0)
MCV: 64.5 fL — ABNORMAL LOW (ref 78.0–100.0)
PLATELETS: 330 10*3/uL (ref 150–400)
RBC: 4.98 MIL/uL (ref 3.87–5.11)
RDW: 19.9 % — ABNORMAL HIGH (ref 11.5–15.5)
WBC: 7.3 10*3/uL (ref 4.0–10.5)

## 2017-04-30 LAB — PREGNANCY, URINE: Preg Test, Ur: NEGATIVE

## 2017-04-30 LAB — POCT I-STAT TROPONIN I: Troponin i, poc: 0 ng/mL (ref 0.00–0.08)

## 2017-04-30 NOTE — ED Notes (Signed)
Failed attempt at blood draw.
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
## 2017-04-30 NOTE — Discharge Instructions (Signed)
If your pain worsens or have any worsening symptoms please return to the ER for repeat evaluation.  Please take Ibuprofen (Advil, motrin) and Tylenol (acetaminophen) to relieve your pain.  You may take up to 600 MG (3 pills) of normal strength ibuprofen every 8 hours as needed.  In between doses of ibuprofen you make take tylenol, up to 1,000 mg (two extra strength pills).  Do not take more than 3,000 mg tylenol in a 24 hour period.  Please check all medication labels as many medications such as pain and cold medications may contain tylenol.  Do not drink alcohol while taking these medications.  Do not take other NSAID'S while taking ibuprofen (such as aleve or naproxen).  Please take ibuprofen with food to decrease stomach upset.  I think the ear chest pain is from muscles and bones being sore. You do not have any blood in your urine.

## 2017-06-05 ENCOUNTER — Emergency Department (HOSPITAL_COMMUNITY)
Admission: EM | Admit: 2017-06-05 | Discharge: 2017-06-05 | Disposition: A | Discharge status: Home / Self Care | Payer: Self-pay | Attending: Emergency Medicine | Admitting: Emergency Medicine

## 2017-06-05 DIAGNOSIS — Z87891 Personal history of nicotine dependence: Secondary | ICD-10-CM | POA: Insufficient documentation

## 2017-06-05 DIAGNOSIS — R202 Paresthesia of skin: Secondary | ICD-10-CM | POA: Insufficient documentation

## 2017-06-05 DIAGNOSIS — Z79899 Other long term (current) drug therapy: Secondary | ICD-10-CM | POA: Insufficient documentation

## 2017-06-05 DIAGNOSIS — D509 Iron deficiency anemia, unspecified: Secondary | ICD-10-CM | POA: Insufficient documentation

## 2017-06-05 DIAGNOSIS — R0789 Other chest pain: Secondary | ICD-10-CM | POA: Insufficient documentation

## 2017-06-05 LAB — BASIC METABOLIC PANEL
ANION GAP: 8 (ref 5–15)
BUN: 13 mg/dL (ref 6–20)
CALCIUM: 8.9 mg/dL (ref 8.9–10.3)
CO2: 25 mmol/L (ref 22–32)
CREATININE: 0.66 mg/dL (ref 0.44–1.00)
Chloride: 102 mmol/L (ref 101–111)
GLUCOSE: 96 mg/dL (ref 65–99)
Potassium: 3.6 mmol/L (ref 3.5–5.1)
Sodium: 135 mmol/L (ref 135–145)

## 2017-06-05 LAB — CBC
HEMATOCRIT: 33.9 % — AB (ref 36.0–46.0)
HEMOGLOBIN: 10.2 g/dL — AB (ref 12.0–15.0)
MCH: 20 pg — ABNORMAL LOW (ref 26.0–34.0)
MCHC: 30.1 g/dL (ref 30.0–36.0)
MCV: 66.6 fL — AB (ref 78.0–100.0)
Platelets: 310 10*3/uL (ref 150–400)
RBC: 5.09 MIL/uL (ref 3.87–5.11)
RDW: 19.9 % — AB (ref 11.5–15.5)
WBC: 8.1 10*3/uL (ref 4.0–10.5)

## 2017-06-05 LAB — I-STAT TROPONIN, ED: Troponin i, poc: 0 ng/mL (ref 0.00–0.08)

## 2017-06-05 NOTE — ED Triage Notes (Signed)
Pt states for for the last couple of weeks she has been left jaw/teeth problems/tingling, left arm problems and chest pain. Denies fever, chills or any other problems.

## 2017-06-05 NOTE — ED Provider Notes (Signed)
[facility]-EMERGENCY DEPT Provider Note   CSN: [id number] Arrival date & time: 06/05/17  2017     History   Chief Complaint Chief Complaint  Patient presents with  . Multiple Complaints    HPI Brittany Mcmillan is a 31 y.o. female.  Signs of left-sided chest pain for the past 2 weeks which is constant accompanied by tingling in her left arm.  She also reports some tingling and pain in her left jaw and teeth.  Pain is nonexertional no shortness of breath nausea or sweatiness.  She treated herself with an aspirin 2 weeks ago but no treatment since.  No shortness of breath nausea or sweatiness.  No other associated symptoms  HPI  Past Medical History:  Diagnosis Date  . Obesity     There are no active problems to display for this patient.   Past Surgical History:  Procedure Laterality Date  . TONSILLECTOMY      OB History    No data available       Home Medications    Prior to Admission medications   Medication Sig Start Date End Date Taking? Authorizing Provider  acetaminophen (TYLENOL) 500 MG tablet Take 500 mg by mouth every 6 (six) hours as needed for headache.    [provider]  benzonatate (TESSALON) 100 MG capsule Take 1 capsule (100 mg total) by mouth every 8 (eight) hours. Patient not taking: Reported on 10/07/2016 07/10/15   Joycie Peekartner, Benjamin, PA-C  chlorpheniramine-HYDROcodone [facility](TUSSIONEX PENNKINETIC ER) 10-8 MG/5ML SUER Take 5 mLs by mouth 2 (two) times daily. Patient not taking: Reported on 10/07/2016 08/15/15   Roxy HorsemanBrowning, Robert, PA-C  ibuprofen (ADVIL,MOTRIN) 800 MG tablet Take 1 tablet (800 mg total) by mouth 3 (three) times daily. Patient not taking: Reported on 10/07/2016 01/21/16   Barrett, Rolm GalaStevi, PA-C  methocarbamol (ROBAXIN) 500 MG tablet Take 1 tablet (500 mg total) by mouth 2 (two) times daily. Patient not taking: Reported on 10/07/2016 01/21/16   Barrett, Rolm GalaStevi, PA-C  methocarbamol (ROBAXIN) 500 MG tablet Take 2 tablets  (1,000 mg total) by mouth 3 (three) times daily as needed for muscle spasms. 04/23/17   Cathren LaineSteinl, Kevin, MD  naproxen sodium (ANAPROX) 220 MG tablet Take 220 mg by mouth 2 (two) times daily as needed (pain).    [provider]  norethindrone (AYGESTIN) 5 MG tablet Take 1 tablet (5 mg total) by mouth daily. 10/07/16 10/17/16  Alvira MondaySchlossman, Erin, MD  sodium chloride (OCEAN) 0.65 % SOLN nasal spray Place 1 spray into both nostrils as needed for congestion. Patient not taking: Reported on 10/07/2016 08/15/15   Roxy HorsemanBrowning, Robert, PA-C    Family History Family History  Problem Relation Age of Onset  . Kidney failure Mother   . Diabetes Mother    no family history of heart disease  Social History Social History  Substance Use Topics  . Smoking status: Former Smoker    Types: Cigarettes  . Smokeless tobacco: Never Used  . Alcohol use Yes     Comment: occ    Ex-smoker quit 2 months ago  Allergies   Patient has no known allergies.   Review of Systems Review of Systems  Constitutional: Negative.   HENT: Negative.   Respiratory: Negative.   Cardiovascular: Positive for chest pain.  Gastrointestinal: Negative.   Genitourinary:       Menses irregular  Musculoskeletal: Negative.   Skin: Negative.   Neurological: Positive for numbness.       This and tingling in left arm  Psychiatric/Behavioral: Negative.   All other systems reviewed and are negative.    Physical Exam Updated Vital Signs BP (!) 147/99 (BP Location: Right Arm)   Pulse 89   Temp 98 F (36.7 C) (Oral)   Resp 16   SpO2 99%   Physical Exam  Constitutional: She appears well-developed and well-nourished. No distress.  HENT:  Head: Normocephalic and atraumatic.  No trismus.  Lower left-sided teeth are tender to touch.  No obvious caries.  No swelling or fluctuance or redness of the gingiva.  Eyes: Pupils are equal, round, and reactive to light. Conjunctivae are normal.  Neck: Neck supple. No tracheal deviation  present. No thyromegaly present.  Cardiovascular: Normal rate, regular rhythm, normal heart sounds and intact distal pulses.   No murmur heard. Radial pulses 2+ bilaterally  Pulmonary/Chest: Effort normal and breath sounds normal. She exhibits tenderness.  Chest pain is easily reproducible by forcible abduction of left shoulder  Abdominal: Soft. Bowel sounds are normal. She exhibits no distension. There is no tenderness.  Morbidly obese  Musculoskeletal: Normal range of motion. She exhibits no edema or tenderness.  Lymphadenopathy:    She has no cervical adenopathy.  Neurological: She is alert. Coordination normal.  Skin: Skin is warm and dry. No rash noted.  Psychiatric: She has a normal mood and affect.  Nursing note and vitals reviewed.    ED Treatments / Results  Labs (all labs ordered are listed, but only abnormal results are displayed) Labs Reviewed  CBC - Abnormal; Notable for the following:       Result Value   Hemoglobin 10.2 (*)    HCT 33.9 (*)    MCV 66.6 (*)    MCH 20.0 (*)    RDW 19.9 (*)    All other components within normal limits  BASIC METABOLIC PANEL  I-STAT TROPONIN, ED    EKG  EKG Interpretation  Date/Time:  Saturday June 05 2017 20:25:21 EDT Ventricular Rate:  83 PR Interval:    QRS Duration: 93 QT Interval:  382 QTC Calculation: 449 R Axis:   53 Text Interpretation:  Sinus rhythm Consider left atrial enlargement No significant change since last tracing Confirmed by Doug Sou [id number]) on 06/05/2017 9:08:58 PM       Radiology No results found.  Procedures Procedures (including critical care time)  Medications Ordered in ED Medications - No data to display Results for orders placed or performed during the hospital encounter of 06/05/17  Basic metabolic panel  Result Value Ref Range   Sodium 135 135 - 145 mmol/L   Potassium 3.6 3.5 - 5.1 mmol/L   Chloride 102 101 - 111 mmol/L   CO2 25 22 - 32 mmol/L   Glucose, Bld 96 65 - 99  mg/dL   BUN 13 6 - 20 mg/dL   Creatinine, Ser 8.84 0.44 - 1.00 mg/dL   Calcium 8.9 8.9 - 16.6 mg/dL   GFR calc non Af Amer >60 >60 mL/min   GFR calc Af Amer >60 >60 mL/min   Anion gap 8 5 - 15  CBC  Result Value Ref Range   WBC 8.1 4.0 - 10.5 K/uL   RBC 5.09 3.87 - 5.11 MIL/uL   Hemoglobin 10.2 (L) 12.0 - 15.0 g/dL   HCT 06.3 (L) 01.6 - 01.0 %   MCV 66.6 (L) 78.0 - 100.0 fL   MCH 20.0 (L) 26.0 - 34.0 pg   MCHC 30.1 30.0 - 36.0 g/dL   RDW 93.2 (H) 35.5 - 73.2 %  Platelets 310 150 - 400 K/uL  I-stat troponin, ED  Result Value Ref Range   Troponin i, poc 0.00 0.00 - 0.08 ng/mL   Comment 3           No results found.  Initial Impression / Assessment and Plan / ED Course  I have reviewed the triage vital signs and the nursing notes.  Pertinent labs & imaging results that were available during my care of the patient were reviewed by me and considered in my medical decision making (see chart for details).    Strongly doubt pulmonary embolism.  No complaint of shortness of breath.  Symptoms highly atypical.  Symptoms highly atypical for acute coronary syndrome.  Heart score equals 1.  Acute with tender teeth I suggest referral to dentist she will be given resource guide also given number for primary care follow-up  Anemia is improved over 1 month ago Patient left the ED without waiting for written instructions.  I did give instructions to her verbally. Final Clinical Impressions(s) / ED Diagnoses  Diagnosis#1 atypical chest pain #2 microcytic anemia Final diagnoses:  None    New Prescriptions New Prescriptions   No medications on file     Doug Sou, MD 06/05/17 2248

## 2017-09-05 ENCOUNTER — Emergency Department (HOSPITAL_COMMUNITY): Payer: BLUE CROSS/BLUE SHIELD

## 2017-09-05 ENCOUNTER — Emergency Department (HOSPITAL_COMMUNITY)
Admission: EM | Admit: 2017-09-05 | Discharge: 2017-09-05 | Disposition: A | Discharge status: Home / Self Care | Payer: BLUE CROSS/BLUE SHIELD | Attending: Emergency Medicine | Admitting: Emergency Medicine

## 2017-09-05 ENCOUNTER — Other Ambulatory Visit: Payer: Self-pay

## 2017-09-05 ENCOUNTER — Encounter (HOSPITAL_COMMUNITY): Payer: Self-pay

## 2017-09-05 DIAGNOSIS — Z79899 Other long term (current) drug therapy: Secondary | ICD-10-CM | POA: Diagnosis not present

## 2017-09-05 DIAGNOSIS — R0789 Other chest pain: Secondary | ICD-10-CM

## 2017-09-05 DIAGNOSIS — Z87891 Personal history of nicotine dependence: Secondary | ICD-10-CM | POA: Insufficient documentation

## 2017-09-05 DIAGNOSIS — R51 Headache: Secondary | ICD-10-CM | POA: Insufficient documentation

## 2017-09-05 DIAGNOSIS — M25512 Pain in left shoulder: Secondary | ICD-10-CM | POA: Diagnosis not present

## 2017-09-05 DIAGNOSIS — R079 Chest pain, unspecified: Secondary | ICD-10-CM | POA: Diagnosis present

## 2017-09-05 DIAGNOSIS — R519 Headache, unspecified: Secondary | ICD-10-CM

## 2017-09-05 LAB — CBC
HCT: 35.2 % — ABNORMAL LOW (ref 36.0–46.0)
Hemoglobin: 10.7 g/dL — ABNORMAL LOW (ref 12.0–15.0)
MCH: 21.6 pg — ABNORMAL LOW (ref 26.0–34.0)
MCHC: 30.4 g/dL (ref 30.0–36.0)
MCV: 71.1 fL — AB (ref 78.0–100.0)
PLATELETS: 295 10*3/uL (ref 150–400)
RBC: 4.95 MIL/uL (ref 3.87–5.11)
RDW: 18.4 % — ABNORMAL HIGH (ref 11.5–15.5)
WBC: 5.3 10*3/uL (ref 4.0–10.5)

## 2017-09-05 LAB — I-STAT TROPONIN, ED: Troponin i, poc: 0 ng/mL (ref 0.00–0.08)

## 2017-09-05 LAB — BASIC METABOLIC PANEL
Anion gap: 8 (ref 5–15)
BUN: 6 mg/dL (ref 6–20)
CHLORIDE: 103 mmol/L (ref 101–111)
CO2: 25 mmol/L (ref 22–32)
CREATININE: 0.63 mg/dL (ref 0.44–1.00)
Calcium: 8.9 mg/dL (ref 8.9–10.3)
GFR calc non Af Amer: >60 mL/min (ref >60)
Glucose, Bld: 95 mg/dL (ref 65–99)
POTASSIUM: 4.6 mmol/L (ref 3.5–5.1)
SODIUM: 136 mmol/L (ref 135–145)

## 2017-09-05 LAB — I-STAT BETA HCG BLOOD, ED (MC, WL, AP ONLY)

## 2017-09-05 NOTE — ED Notes (Signed)
Declined W/C at D/C and was escorted to lobby by RN. 

## 2017-09-05 NOTE — Discharge Instructions (Signed)
Please read attached information. If you experience any new or worsening signs or symptoms please return to the emergency room for evaluation. Please follow-up with your primary care provider for your headaches and cardiologist for chest pain.

## 2017-09-05 NOTE — ED Provider Notes (Signed)
[facility]   CSN: [id number] Arrival date & time: 09/05/17  1058     History   Chief Complaint Chief Complaint  Patient presents with  . Chest Pain    HPI Lyna PoserMonica Rosiak is a 32 y.o. female.  HPI   32 year old female presents today with numerous complaints.  Patient reports a 2-week history of left-sided chest pain.  Worse with movement worse with palpation, she notes this is associated with tightness in the chest, dizziness and shortness of breath.  She notes these symptoms have been coming and going, but always has some baseline chest pain.  Patient notes this is similar to symptoms she has had several months ago where she was evaluated findings.  Also notes she has pain in her left lateral neck and left shoulder, she notes that range of motion of the left shoulder causes shoulder pain and chest pain.  She denies any trauma.  She denies any infectious etiology including cough or fever.  Patient reports that she recently quit smoking approximately 3 months ago and has been exercising starting 2 weeks ago.  She also notes left-sided headache described as pressure without specific pain no acute neurological deficits.  Denies any neck stiffness.  She denies any history or risk factors of DVT or PE or lower extremity swelling or edema.  She has no personal history of MI, she reports that her grandmother had a heart attack in her 9650s.  Patient notes she has had significant anxiety over the last month.  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     Past Medical History:  Diagnosis Date  . Obesity     There are no active problems to display for this patient.   Past Surgical History:  Procedure Laterality Date  . TONSILLECTOMY      OB History    No data available       Home Medications    Prior to Admission medications   Medication Sig Start Date End Date Taking? Authorizing Provider    acetaminophen (TYLENOL) 500 MG tablet Take 500 mg by mouth every 6 (six) hours as needed for headache.    [provider]  benzonatate (TESSALON) 100 MG capsule Take 1 capsule (100 mg total) by mouth every 8 (eight) hours. Patient not taking: Reported on 10/07/2016 07/10/15   Joycie Peekartner, Benjamin, PA-C  chlorpheniramine-HYDROcodone [facility](TUSSIONEX PENNKINETIC ER) 10-8 MG/5ML SUER Take 5 mLs by mouth 2 (two) times daily. Patient not taking: Reported on 10/07/2016 08/15/15   Roxy HorsemanBrowning, Robert, PA-C  ibuprofen (ADVIL,MOTRIN) 800 MG tablet Take 1 tablet (800 mg total) by mouth 3 (three) times daily. Patient not taking: Reported on 10/07/2016 01/21/16   Barrett, Rolm GalaStevi, PA-C  methocarbamol (ROBAXIN) 500 MG tablet Take 1 tablet (500 mg total) by mouth 2 (two) times daily. Patient not taking: Reported on 10/07/2016 01/21/16   Barrett, Rolm GalaStevi, PA-C  methocarbamol (ROBAXIN) 500 MG tablet Take 2 tablets (1,000 mg total) by mouth 3 (three) times daily as needed for muscle spasms. 04/23/17   Cathren LaineSteinl, Kevin, MD  naproxen sodium (ANAPROX) 220 MG tablet Take 220 mg by mouth 2 (two) times daily as needed (pain).    [provider]  norethindrone (AYGESTIN) 5 MG tablet Take 1 tablet (5 mg total) by mouth daily. 10/07/16 10/17/16  Alvira MondaySchlossman, Erin, MD  sodium chloride (OCEAN) 0.65 % SOLN nasal spray Place 1 spray into both nostrils as needed for congestion. Patient not taking: Reported  on 10/07/2016 08/15/15   Roxy Horseman, PA-C    Family History Family History  Problem Relation Age of Onset  . Kidney failure Mother   . Diabetes Mother     Social History Social History   Tobacco Use  . Smoking status: Former Smoker    Types: Cigarettes  . Smokeless tobacco: Never Used  Substance Use Topics  . Alcohol use: Yes    Comment: occ   . Drug use: No     Allergies   Patient has no known allergies.   Review of Systems Review of Systems  All other systems reviewed and are negative.    Physical  Exam Updated Vital Signs BP 103/77 (BP Location: Right Arm)   Pulse 76   Temp 98.8 F (37.1 C) (Oral)   Resp 12   LMP 07/06/2017   SpO2 100%   Physical Exam  Constitutional: She is oriented to person, place, and time. She appears well-developed and well-nourished.  Morbidly obese  HENT:  Head: Normocephalic and atraumatic.  Eyes: Conjunctivae are normal. Pupils are equal, round, and reactive to light. Right eye exhibits no discharge. Left eye exhibits no discharge. No scleral icterus.  Neck: Normal range of motion. No JVD present. No tracheal deviation present.  Cardiovascular: Normal rate, regular rhythm, normal heart sounds and intact distal pulses. Exam reveals no gallop and no friction rub.  No murmur heard. Pulmonary/Chest: Effort normal and breath sounds normal. No stridor. No respiratory distress. She has no wheezes. She has no rales. She exhibits tenderness.  Tenderness palpation of left anterior chest wall and shoulder.  Neck supple full active range of motion-no rash or redness  Abdominal: Soft.  Musculoskeletal:  No appreciable edema bilateral lower extremities symmetrical  Neurological: She is alert and oriented to person, place, and time. No cranial nerve deficit. She exhibits normal muscle tone. Coordination normal.  Skin: Skin is warm.  Psychiatric: She has a normal mood and affect. Her behavior is normal. Judgment and thought content normal.  Nursing note and vitals reviewed.    ED Treatments / Results  Labs (all labs ordered are listed, but only abnormal results are displayed) Labs Reviewed  CBC - Abnormal; Notable for the following components:      Result Value   Hemoglobin 10.7 (*)    HCT 35.2 (*)    MCV 71.1 (*)    MCH 21.6 (*)    RDW 18.4 (*)    All other components within normal limits  BASIC METABOLIC PANEL  I-STAT TROPONIN, ED  I-STAT BETA HCG BLOOD, ED ([facility], [facility], [facility] ONLY)    EKG  EKG Interpretation None      ED ECG REPORT   Date:  09/05/2017  Rate: 95   Rhythm: normal sinus rhythm  QRS Axis: normal  Intervals: normal  ST/T Wave abnormalities: normal  Conduction Disutrbances:none  Narrative Interpretation:   Old EKG Reviewed: non specific T wave changes in lead three otherwise no other changes   I have personally reviewed the EKG tracing and agree with the computerized printout as noted.  Radiology Dg Chest 2 View  Result Date: 09/05/2017 CLINICAL DATA:  32 year old female with a history of headache EXAM: CHEST  2 VIEW COMPARISON:  None. FINDINGS: Cardiomediastinal silhouette unchanged in size and contour. No evidence of central vascular congestion. No pneumothorax or pleural effusion. No confluent airspace disease. IMPRESSION: No radiographic evidence of acute cardiopulmonary disease Electronically Signed   By: Gilmer Mor D.O.   On: 09/05/2017 11:27  Procedures Procedures (including critical care time)  Medications Ordered in ED Medications - No data to display   Initial Impression / Assessment and Plan / ED Course  I have reviewed the triage vital signs and the nursing notes.  Pertinent labs & imaging results that were available during my care of the patient were reviewed by me and considered in my medical decision making (see chart for details).     Final Clinical Impressions(s) / ED Diagnoses   Final diagnoses:  Acute nonintractable headache, unspecified headache type  Chest wall pain  Atypical chest pain  Acute pain of left shoulder    Labs: I-STAT troponin, i-STAT beta-hCG, BMP CBC  Imaging: Chest 2 view; EKG  Consults:  Therapeutics:  Discharge Meds:   Assessment/Plan:   32 year old female presents today with numerous complaints.  Patient has very atypical chest pain.  I have very low suspicion for ACS (heart score 1), PE, dissection or any other acute anterior thoracic pathology.  Patient has reproducible chest pain on my exam, she is also having reproducible shoulder pain.  This  is been several months of symptoms.  She has reassuring workup here with no signs of ACS.  Patient PERC negative with a low Wells score.  I have very high suspicion for anxiety in this patient.  She is also having a headache with no associated neurological deficits, I have encouraged her to follow-up with both her primary care for reevaluation, and close follow-up with cardiology.  Patient has been referred in the past but did not follow-up I is very adamant that she did follow up this time as she reports she has insurance and will make his follow-up appointments.  She is given strict return precautions, she verbalized understanding and agreement to today's plan had no further questions or concerns at the time of discharge.    ED Discharge Orders    None       Rosalio Loud 09/05/17 1218    Terrilee Files, MD 09/07/17 [id number]

## 2017-09-05 NOTE — ED Triage Notes (Signed)
Pt reports chest pain and headache X1 week. She reports pain radiating down her left arm and into the left side of her neck. No distress noted. Pt alert and oriented.

## 2018-02-14 ENCOUNTER — Ambulatory Visit: Payer: BLUE CROSS/BLUE SHIELD | Admitting: Family Medicine

## 2018-07-05 ENCOUNTER — Institutional Professional Consult (permissible substitution): Payer: Self-pay | Admitting: Internal Medicine

## 2018-11-20 IMAGING — CR DG CHEST 2V
2 series · 2 of 2 positions shown · non-contrast
Comparison: None.

CLINICAL DATA: 31-year-old female with a history of headache

EXAM:
CHEST  2 VIEW

[chest pa]
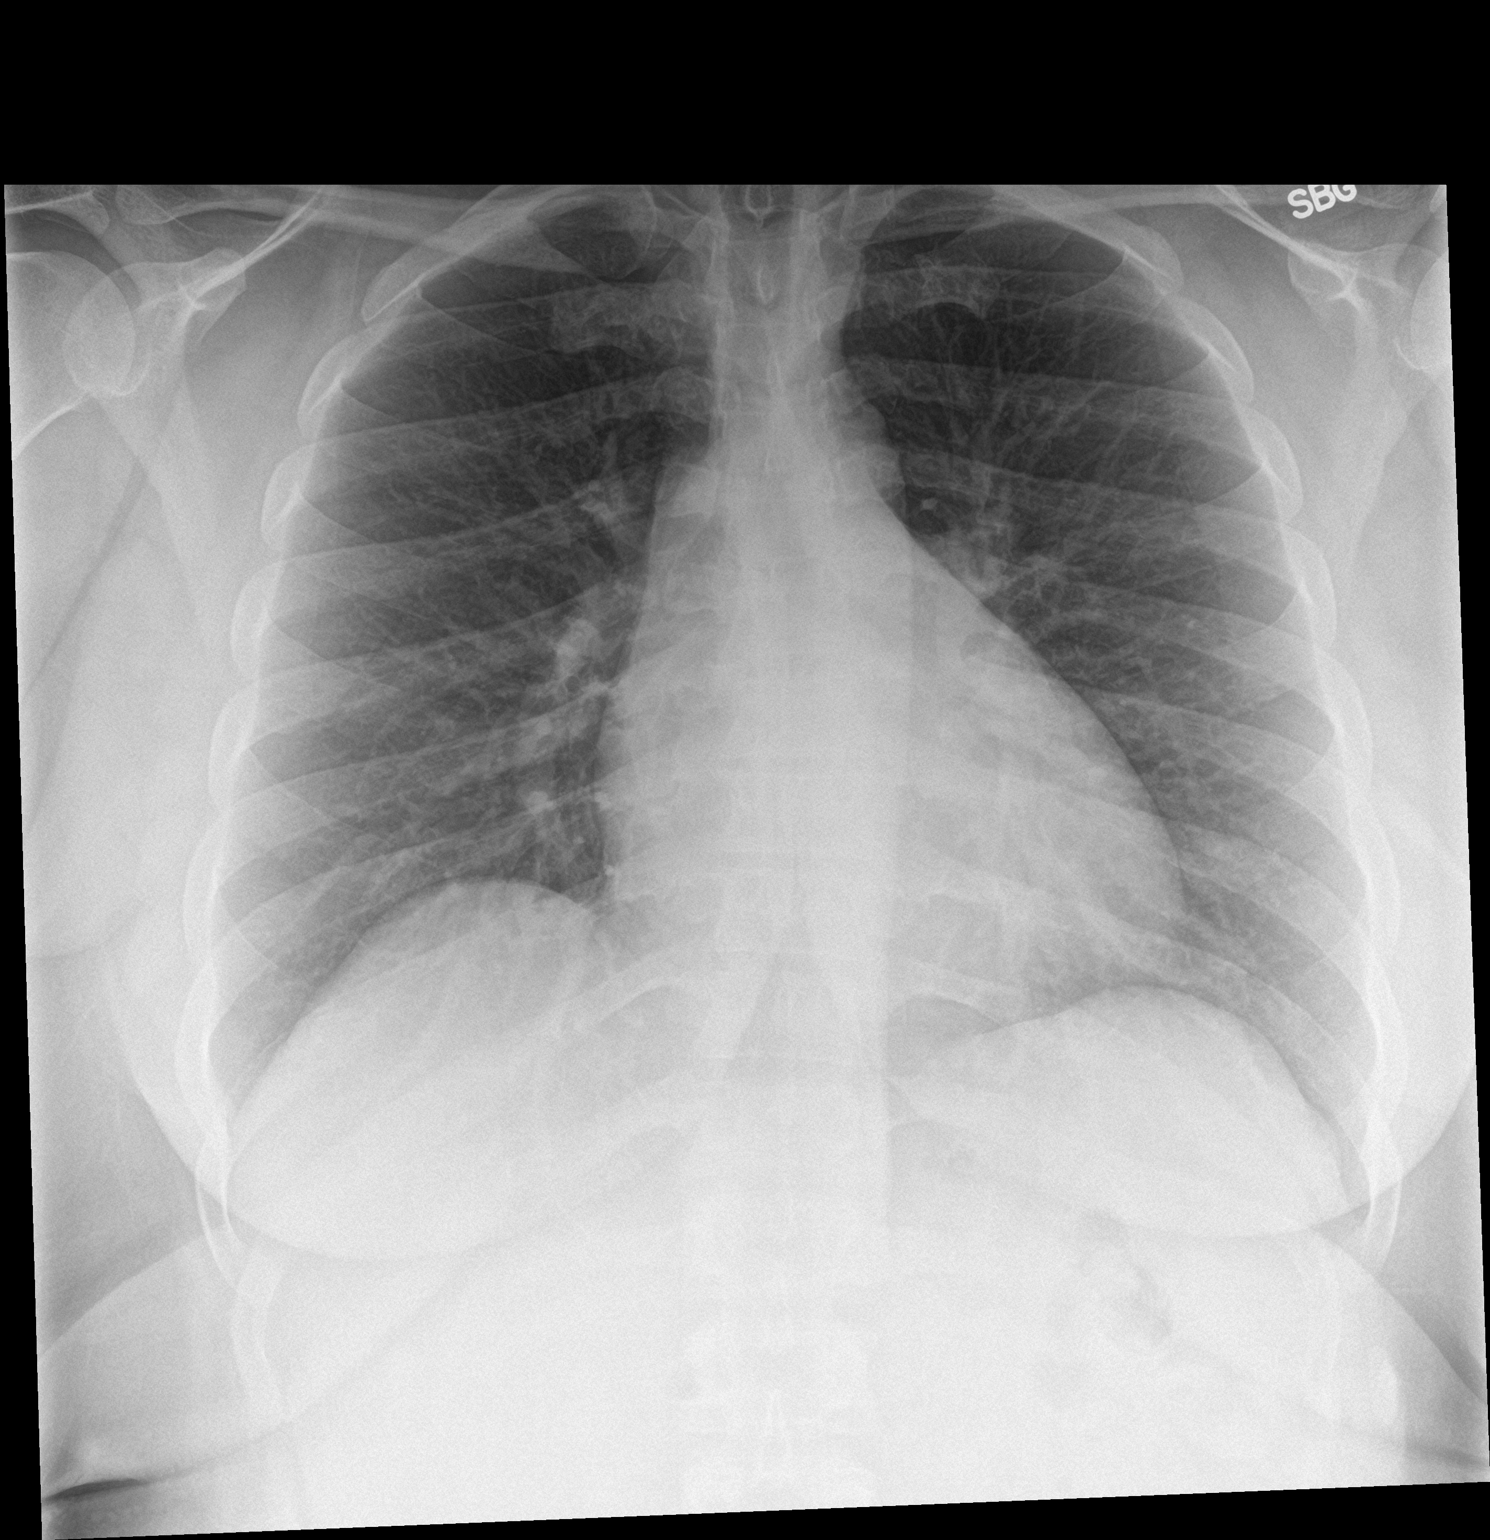

[chest lat]
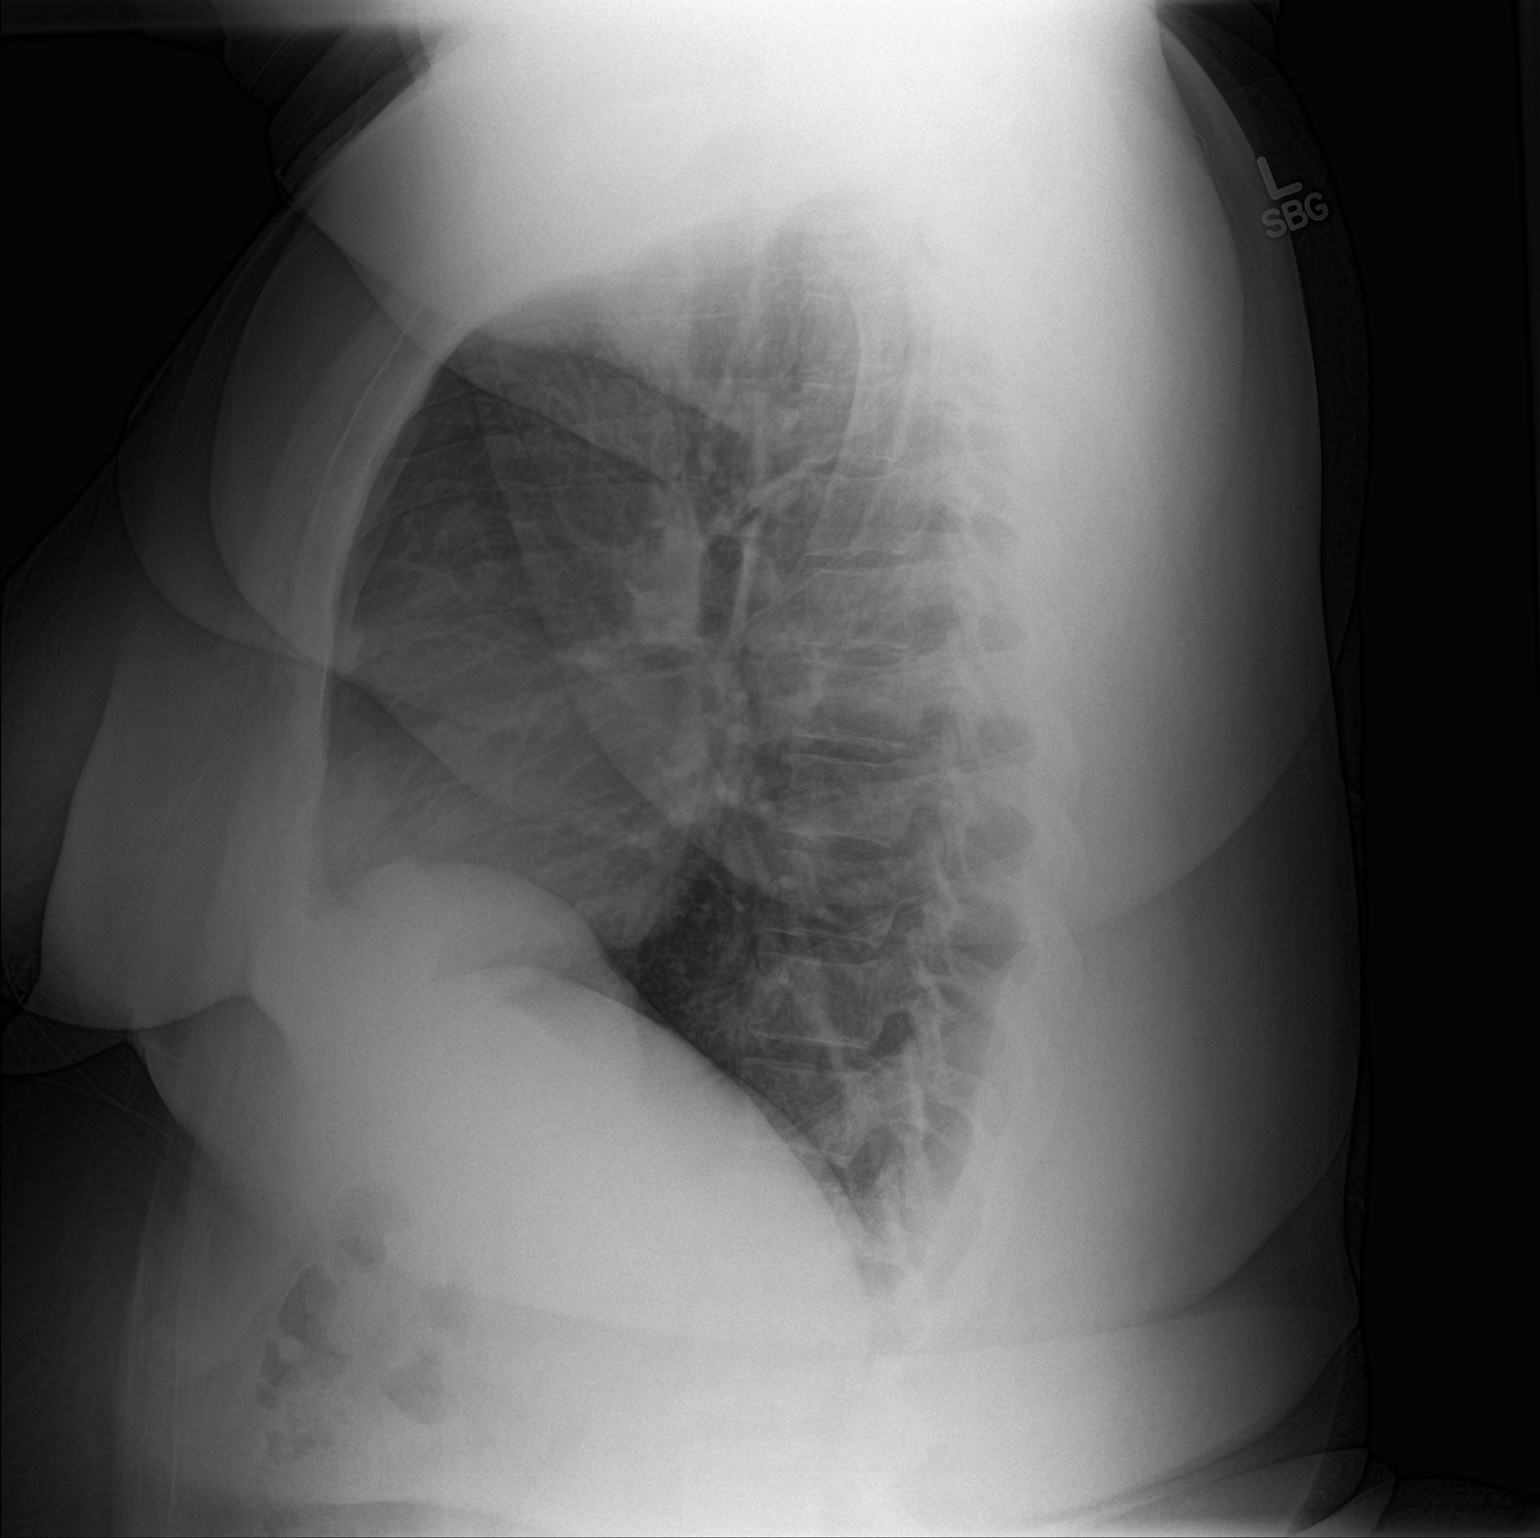

[2 of 2 positions shown; findings below may reference images not displayed]

FINDINGS: Cardiomediastinal silhouette unchanged in size and contour. No
evidence of central vascular congestion. No pneumothorax or pleural
effusion. No confluent airspace disease.
IMPRESSION: No radiographic evidence of acute cardiopulmonary disease

## 2020-10-10 ENCOUNTER — Ambulatory Visit: Payer: Self-pay | Admitting: Family

## 2023-01-09 DIAGNOSIS — R110 Nausea: Secondary | ICD-10-CM | POA: Diagnosis not present

## 2023-01-09 DIAGNOSIS — G43909 Migraine, unspecified, not intractable, without status migrainosus: Secondary | ICD-10-CM | POA: Diagnosis not present

## 2023-01-09 DIAGNOSIS — I10 Essential (primary) hypertension: Secondary | ICD-10-CM | POA: Diagnosis not present

## 2023-03-07 DIAGNOSIS — G4733 Obstructive sleep apnea (adult) (pediatric): Secondary | ICD-10-CM | POA: Diagnosis not present
